# Patient Record
Sex: Male | Born: 2000 | Race: Black or African American | Hispanic: No | Marital: Single
Health system: Southern US, Community
[De-identification: ages and names within clinical notes are randomized; demographics above are authoritative.]

---
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
## 2022-10-20 ENCOUNTER — Other Ambulatory Visit: Payer: Self-pay

## 2022-10-20 NOTE — Progress Notes (Signed)
Completed UDS, Hr notified.

## 2024-03-13 ENCOUNTER — Ambulatory Visit: Payer: Self-pay | Admitting: Family Medicine

## 2024-04-22 ENCOUNTER — Ambulatory Visit: Payer: Self-pay | Admitting: Family Medicine

## 2024-06-07 ENCOUNTER — Ambulatory Visit: Payer: Self-pay | Admitting: Nurse Practitioner
